# Patient Record
Sex: Male | Born: 1935 | Race: White | Hispanic: No | Marital: Married
Health system: Southern US, Community
[De-identification: ages and names within clinical notes are randomized; demographics above are authoritative.]

---

## 2005-05-23 ENCOUNTER — Emergency Department: Payer: Self-pay | Admitting: Emergency Medicine

## 2006-01-21 ENCOUNTER — Emergency Department: Payer: Self-pay | Admitting: Emergency Medicine

## 2006-01-21 ENCOUNTER — Other Ambulatory Visit: Payer: Self-pay

## 2006-12-22 IMAGING — CR DG CHEST 2V
1 series · 2 of 2 positions shown · non-contrast
Comparison: none

REASON FOR EXAM: Hypertension, chest pain
COMMENTS:

[Series 3435: postero_anterior · 0.11mm/px · 2 of 2 slices shown]
[im 1/2]
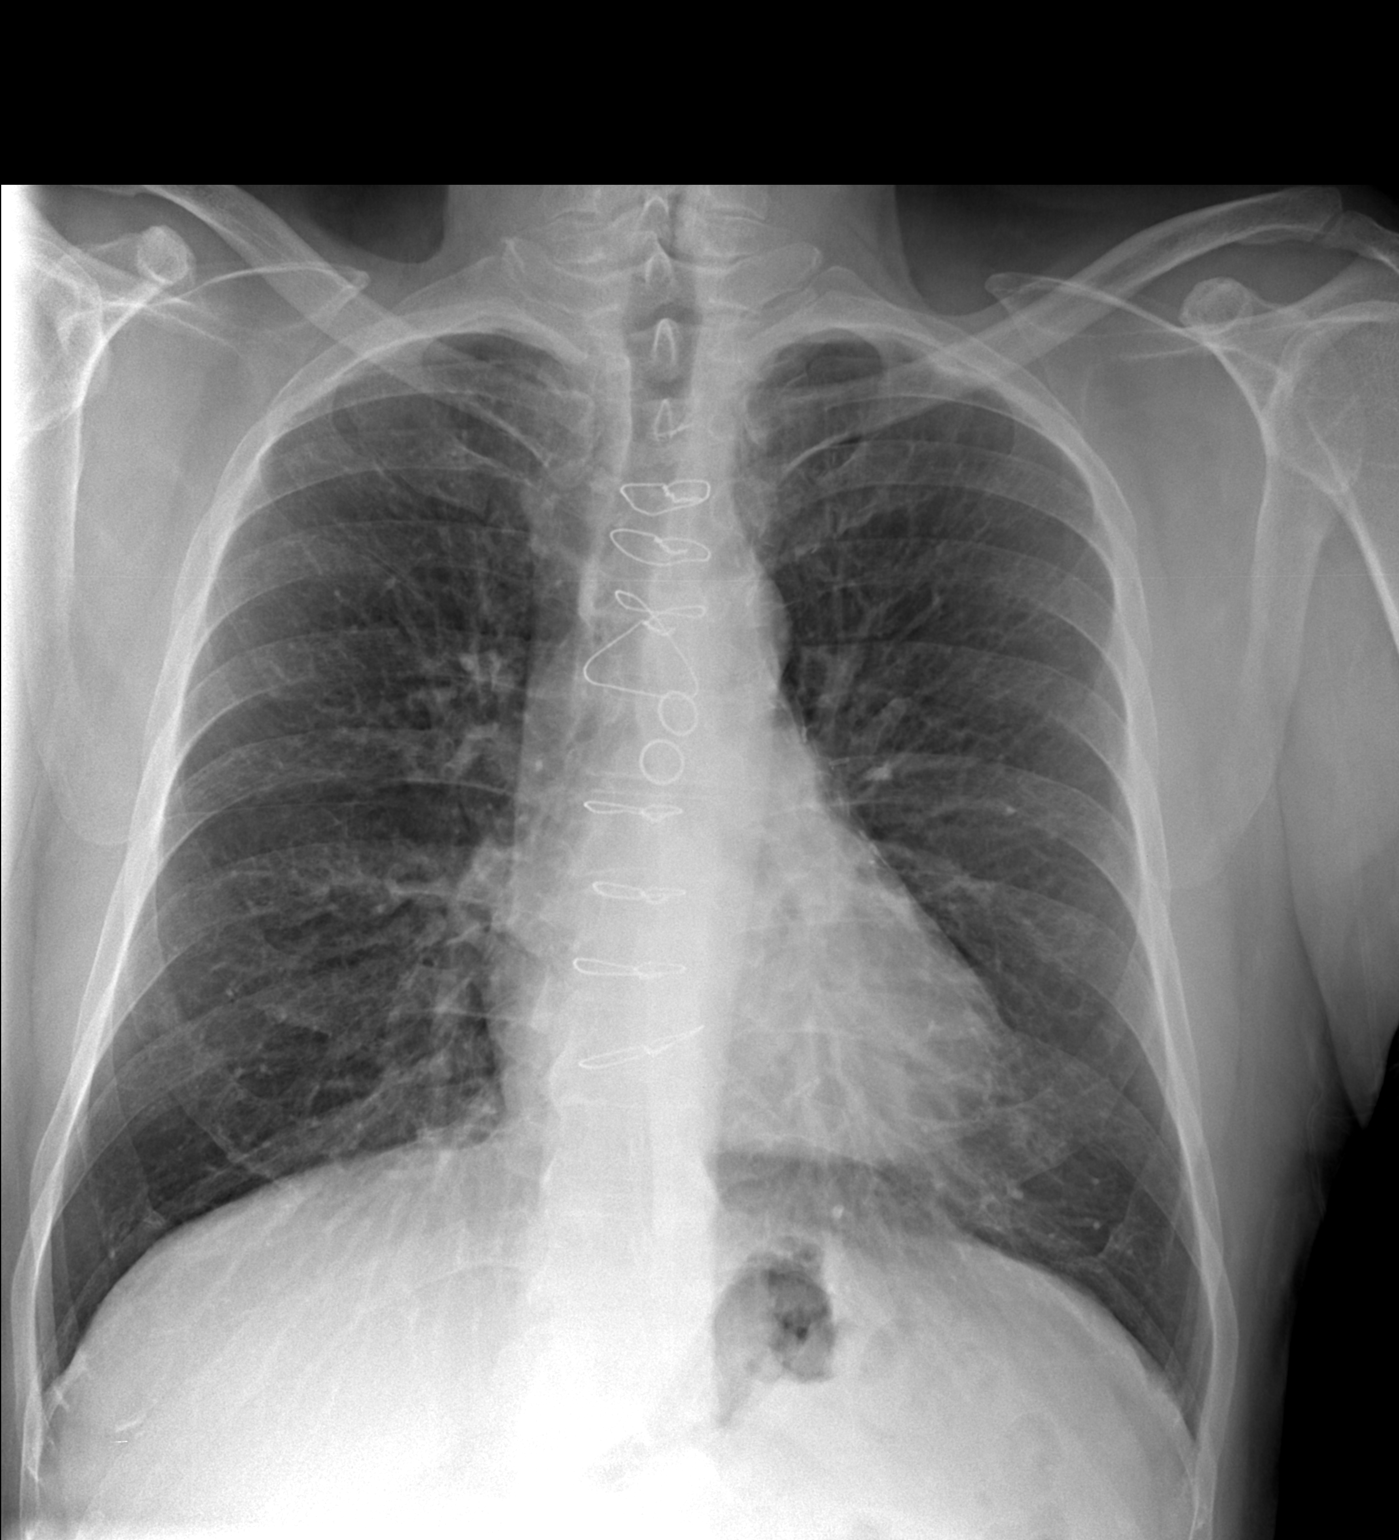
[im 2/2]
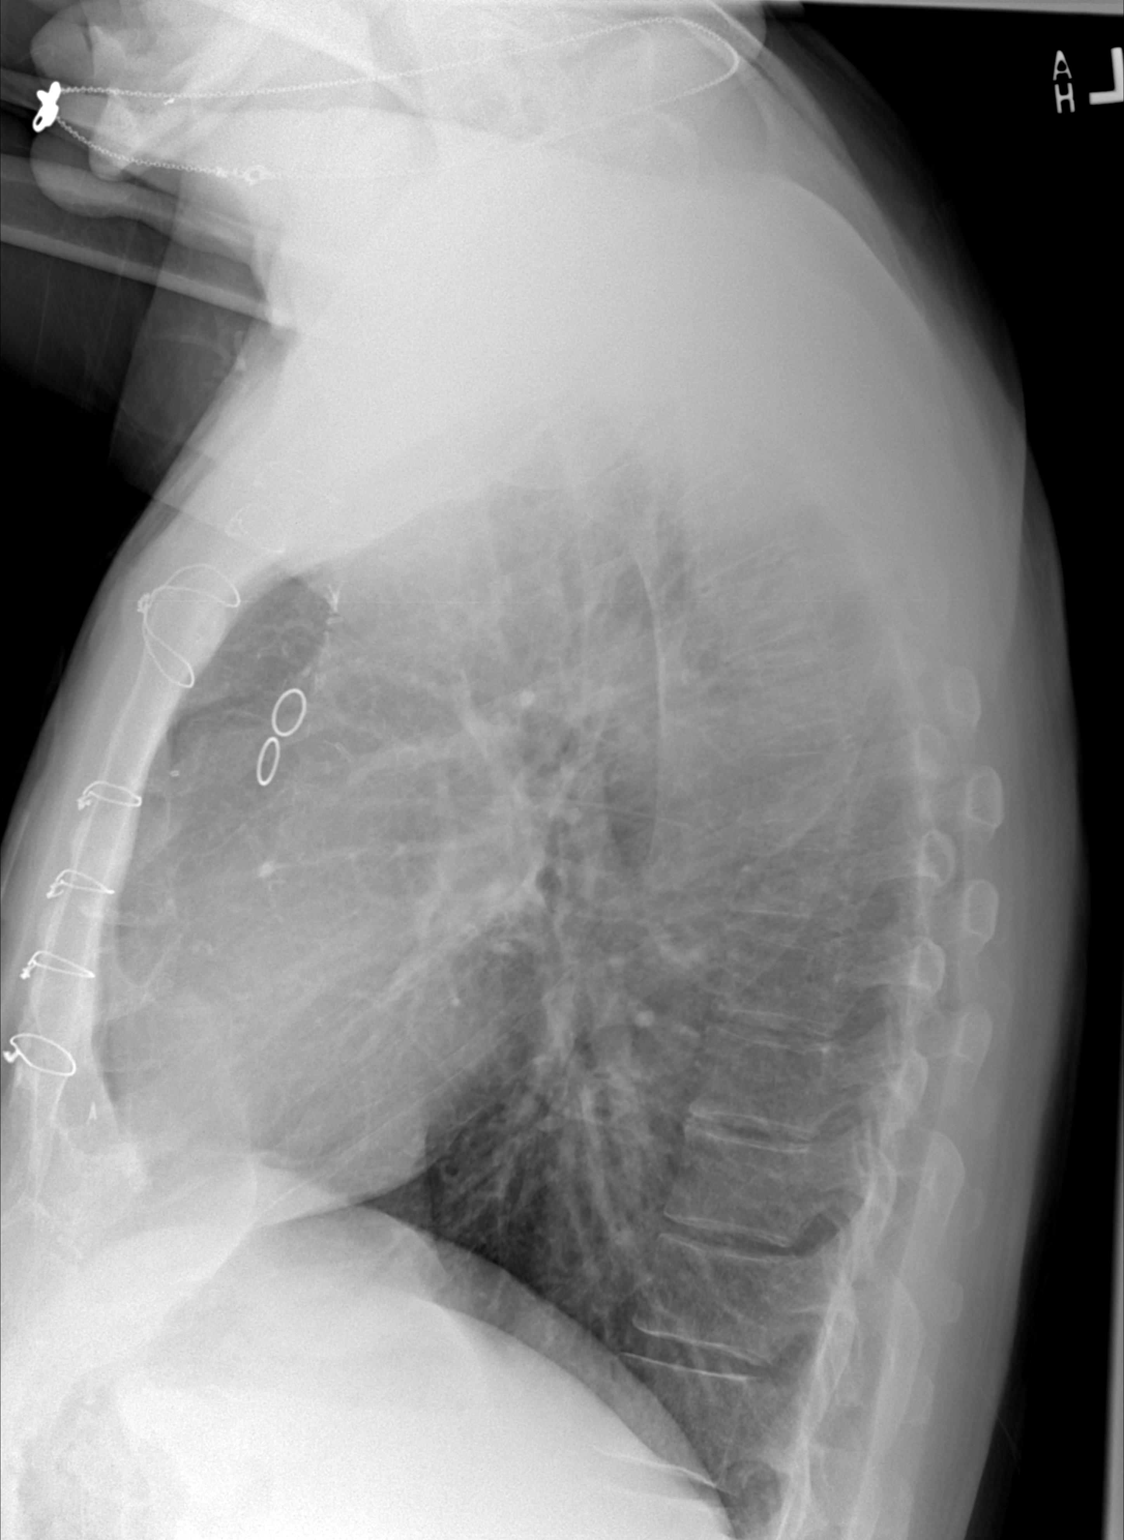

[2 of 2 positions shown; findings below may reference images not displayed]

PROCEDURE:     DXR - DXR CHEST PA (OR AP) AND LATERAL  - January 21, 2006 [DATE]

RESULT:     PA and lateral views reveal the cardiomediastinal structures to
be within normal limits. The lung fields are clear. The vascularity is
within normal limits. No effusions or pneumothoraces are seen. The patient
is status post CABG.
IMPRESSION: The lung fields are clear.

## 2014-01-04 ENCOUNTER — Inpatient Hospital Stay: Payer: Self-pay | Admitting: Internal Medicine

## 2014-01-04 LAB — CBC
HCT: 48.4 % (ref 40.0–52.0)
HGB: 16.1 g/dL (ref 13.0–18.0)
MCH: 30.7 pg (ref 26.0–34.0)
MCHC: 33.2 g/dL (ref 32.0–36.0)
MCV: 92 fL (ref 80–100)
PLATELETS: 191 10*3/uL (ref 150–440)
RBC: 5.25 10*6/uL (ref 4.40–5.90)
RDW: 14.3 % (ref 11.5–14.5)
WBC: 11.4 10*3/uL — ABNORMAL HIGH (ref 3.8–10.6)

## 2014-01-04 LAB — COMPREHENSIVE METABOLIC PANEL
ALK PHOS: 91 U/L
Albumin: 4.1 g/dL (ref 3.4–5.0)
Anion Gap: 7 (ref 7–16)
BUN: 14 mg/dL (ref 7–18)
Bilirubin,Total: 0.6 mg/dL (ref 0.2–1.0)
CO2: 27 mmol/L (ref 21–32)
Calcium, Total: 8.7 mg/dL (ref 8.5–10.1)
Chloride: 106 mmol/L (ref 98–107)
Creatinine: 1.27 mg/dL (ref 0.60–1.30)
GFR CALC NON AF AMER: 55 — AB
GLUCOSE: 137 mg/dL — AB (ref 65–99)
OSMOLALITY: 282 (ref 275–301)
Potassium: 3.6 mmol/L (ref 3.5–5.1)
SGOT(AST): 33 U/L (ref 15–37)
SGPT (ALT): 35 U/L (ref 12–78)
Sodium: 140 mmol/L (ref 136–145)
Total Protein: 8 g/dL (ref 6.4–8.2)

## 2014-01-04 LAB — APTT: Activated PTT: 29.1 secs (ref 23.6–35.9)

## 2014-01-04 LAB — TROPONIN I
Troponin-I: 0.08 ng/mL — ABNORMAL HIGH
Troponin-I: 0.64 ng/mL — ABNORMAL HIGH

## 2014-01-04 LAB — PROTIME-INR
INR: 0.9
Prothrombin Time: 12.3 secs (ref 11.5–14.7)

## 2014-01-04 LAB — CK-MB
CK-MB: 6.5 ng/mL — ABNORMAL HIGH (ref 0.5–3.6)
CK-MB: 128.1 ng/mL — ABNORMAL HIGH (ref 0.5–3.6)

## 2014-01-04 LAB — CK TOTAL AND CKMB (NOT AT ARMC)
CK, Total: 160 U/L (ref 35–232)
CK-MB: 2.6 ng/mL (ref 0.5–3.6)

## 2014-01-05 LAB — LIPID PANEL
Cholesterol: 190 mg/dL (ref 0–200)
HDL Cholesterol: 43 mg/dL (ref 40–60)
LDL CHOLESTEROL, CALC: 118 mg/dL — AB (ref 0–100)
Triglycerides: 145 mg/dL (ref 0–200)
VLDL Cholesterol, Calc: 29 mg/dL (ref 5–40)

## 2014-01-05 LAB — TSH: Thyroid Stimulating Horm: 1.47 u[IU]/mL

## 2014-01-05 LAB — APTT
ACTIVATED PTT: 59.7 s — AB (ref 23.6–35.9)
Activated PTT: 47 secs — ABNORMAL HIGH (ref 23.6–35.9)

## 2014-01-05 LAB — MAGNESIUM: Magnesium: 2 mg/dL

## 2014-01-05 LAB — HEMOGLOBIN A1C: HEMOGLOBIN A1C: 5.7 % (ref 4.2–6.3)

## 2014-01-06 LAB — BASIC METABOLIC PANEL
Anion Gap: 7 (ref 7–16)
BUN: 9 mg/dL (ref 7–18)
CHLORIDE: 105 mmol/L (ref 98–107)
Calcium, Total: 7.9 mg/dL — ABNORMAL LOW (ref 8.5–10.1)
Co2: 22 mmol/L (ref 21–32)
Creatinine: 0.94 mg/dL (ref 0.60–1.30)
Glucose: 111 mg/dL — ABNORMAL HIGH (ref 65–99)
Osmolality: 268 (ref 275–301)
Potassium: 4.7 mmol/L (ref 3.5–5.1)
SODIUM: 134 mmol/L — AB (ref 136–145)

## 2014-01-06 LAB — CK TOTAL AND CKMB (NOT AT ARMC)
CK, TOTAL: 322 U/L — AB (ref 35–232)
CK-MB: 15.1 ng/mL — ABNORMAL HIGH (ref 0.5–3.6)

## 2015-04-21 NOTE — H&P (Signed)
PATIENT NAME:  [name] Casey, [name] Casey MR#:  [id number] DATE OF BIRTH:  05-10-1935  DATE OF ADMISSION:  01/04/2014  PRIMARY CARE PHYSICIAN: None.   CHIEF COMPLAINT: Chest heaviness.   HISTORY OF PRESENT ILLNESS:  A 80 year old Caucasian male patient with history of CABG, hypertension, hyperlipidemia, not on any medications and medical care, presents to the Emergency Room complaining of acute onset of chest heaviness of 2 hours. The patient had similar pain yesterday at rest, which resolved. Today, this is in the midsternal area, heaviness with non-radiation. No aggravating or alleviating factors, other than improvement with nitro here in the Emergency Room. The patient's blood pressure is significantly elevated to 226/134, tachycardic. He complains of shortness of breath, but no wheezing or cough. No lower extremity swelling. No nausea or vomiting. Has diaphoresis. The patient had CABG 12 years back. Has not been on any medications or seen a doctor in 5 years. He continues to smoke 1/2 pack a day. He uses occasional alcohol.   PAST MEDICAL HISTORY:   1.  Coronary artery disease status post CABG 12 years back.  2.  Hypertension.  3.  Hyperlipidemia.  4.  Tobacco abuse.  5.  Past alcohol abuse.   FAMILY HISTORY: Coronary artery disease.   SOCIAL HISTORY: The patient smokes 1/2 pack a day. Occasional alcohol. Mentions he drinks about 3 to 4 drinks a week. No illicit drug use. Lives alone. Ambulates on his own.   CODE STATUS: Full code.   ALLERGIES: No known drug allergies.   HOME MEDICATIONS: None.   REVIEW OF SYSTEMS:  CONSTITUTIONAL: Complains of fatigue.  EYES: No blurred vision, pain or redness.  EARS, NOSE, THROAT: No tinnitus, ear pain or hearing loss.  RESPIRATORY: No cough, wheeze, hemoptysis or shortness of breath.  CARDIOVASCULAR: Has chest heaviness.  GASTROINTESTINAL: No nausea, vomiting or diarrhea.  GENITOURINARY: No dysuria, hematuria or frequency.  ENDOCRINE: No polyuria,  nocturia, thyroid problems.  HEMATOLOGIC AND LYMPHATIC: No anemia, easy bruising or bleeding.  INTEGUMENTARY: No acne, rash, lesions.  MUSCULOSKELETAL: No back pain or arthritis.  NEUROLOGIC: No focal numbness, weakness, seizures.  PSYCHIATRIC: No anxiety or depression.   PHYSICAL EXAMINATION: VITAL SIGNS: Shows temperature of 97.9, pulse of 101, blood pressure 226/134 and 172/103, saturating 97% on room air.  GENERAL: Elderly Caucasian male patient, sitting up in bed, anxious, in distress secondary to his chest pain.  PSYCHIATRIC: Alert, oriented x 3, anxious.  HEENT: Atraumatic, normocephalic. Oral mucosa moist and pink. External ears and nose normal. No pallor. No icterus.  NECK: Supple. No thyromegaly or palpable lymph nodes. Trachea midline. No carotid bruit or JVD.  CARDIOVASCULAR: S1, S2, tachycardic without any murmurs.  RESPIRATORY: Normal work of breathing. Clear to auscultation on both sides.  GASTROINTESTINAL: Soft abdomen, nontender. Bowel sounds present.  MUSCULOSKELETAL: No joint swelling, redness, effusion of the large joints. Normal muscle tone.  Motor strength 5/5 in upper and lower extremities.  LYMPHATIC: No cervical lymphadenopathy.   LABORATORY STUDIES: Show glucose 137, BUN 14, creatinine 1.27, sodium 140, potassium 3.6, chloride 106, GFR of 55. AST, ALT, alkaline phosphatase, bilirubin normal. Troponin 0.08. WBC 11.4, hemoglobin 16, platelets of 191 with INR of 0.9.   EKG shows normal sinus rhythm with sinus tachycardia, ST depression in lead II, III, aVF, which are worse compared to his prior EKG on record. He also has some ST depression in lateral leads. Poor R wave progression.   Chest x-ray shows no acute abnormalities, changes from prior CABG.   ASSESSMENT AND PLAN: 1.  Non-ST elevation myocardial infarction with EKG changes in a patient with no medical care, not on any medications. The patient will be started on aspirin and a beta blocker. We will give him a  stat dose of Lopressor 2.5 mg IV, along with nitro paste, morphine for his pain, oxygen. Dr. Juliann Casey of cardiology has been contacted, who will see the patient, and the patient will be going to the cath lab. We will admit the patient to stepdown on tele and keep him n.p.o. at this time. Depending on his ejection fraction, the patient can be started on ACE inhibitor in 48 hours after cath. No signs of congestive heart failure at this time.  2.  Accelerated hypertension. The patient will be started on a beta blocker.  3.  I counseled the patient to quit smoking. I have also stressed the importance of quitting smoking with his history of CABG and present MI. Counseling time spent was greater than 3 minutes.  4.  Hyperlipidemia. The patient will be started on a statin.  5.  Deep vein thrombosis prophylaxis. Presently, the patient is on heparin drip and will be started on Lovenox after this.   CODE STATUS: Full code.   Time spent today on this critically ill patient with NSTEMI is 45 minutes.  ____________________________ Molinda Bailiff Gabryelle Whitmoyer, MD srs:dmm D: 01/04/2014 10:35:48 ET T: 01/04/2014 11:11:16 ET JOB#: [id number]  cc: [name] Heath R. Jullian Clayson, MD, <Dictator> [name] D. Juliann Pares, MD Orie Fisherman MD ELECTRONICALLY SIGNED 01/17/2014 18:08

## 2015-04-21 NOTE — Consult Note (Signed)
PATIENT NAME:  [name] Casey, [name] Casey MR#:  [id number] DATE OF BIRTH:  [birth year]  DATE OF CONSULTATION:  01/04/2014  REFERRING PHYSICIAN:  Dr. Elpidio AnisSudini CONSULTING PHYSICIAN:  Dwayne D. Callwood, MD  INDICATION: Unstable angina.   HISTORY OF PRESENT ILLNESS: Mr. [name] Casey is a 80 year old white male with history of , hypertension, and hyperlipidemia. He is not on any significant medications. Quit taking his medicines about five years ago and had bypass about 12 years ago. Still smokes, used to drink, but quit. Blood pressures are significantly elevated above 200.  He presented with shortness of breath, chest tightness, congestion, pain on and off and at rest, worse today,  worse than he has ever had it even before his bypass. He presented for further evaluation.   PAST MEDICAL HISTORY: Coronary artery disease, hypertension, hyperlipidemia, tobacco abuse, past history of alcohol abuse and noncompliance.   FAMILY HISTORY: Coronary artery disease.   SOCIAL HISTORY: Smokes, occasional alcohol consumption. No drug use. Lives alone   ALLERGIES: None.   HOME MEDICATIONS:  None.   REVIEW OF SYSTEMS: No blackout spells or syncope. No nausea or vomiting. No fever, no chills, no sweats. No weight loss. No weight gain, hemoptysis, hematemesis. No bright red blood per rectum. Denies any urinary changes. No sputum production or cough.   PHYSICAL EXAMINATION:   VITAL SIGNS:  Blood pressure initially 200/130 improved to 170/100, respiratory rate 16, afebrile.  HEENT: Normocephalic, atraumatic. Pupils equal and reactive to light.  NECK: Supple. No significant JVD, bruits or adenopathy.  LUNGS: Clear to auscultation and percussion. No wheezing, rhonchi, or rale.   HEART: Regular rate and rhythm. Positive S4. Systolic ejection murmur at the apex.  ABDOMEN: Benign.  EXTREMITY: Within normal limits.  NEUROLOGIC: Intact.  SKIN: Normal.   LABORATORY, DIAGNOSTIC AND RADIOLOGIC DATA: Glucose of 137, BUN 14, creatinine  1.27, sodium 140, potassium 3.6, chloride 106, AST and ALT negative. Troponin 0.08. White count 114, hemoglobin 16, platelet count of 191. INR 0.9.   EKG: Normal sinus rhythm, nonspecific ST changes, diffuse ST segment depression.    Chest x-ray:  Unremarkable.   IMPRESSION: 1.  Possible non-Q-wave myocardial infarction.  2.  Unstable angina.  3.  Malignant hypertension.  4.  Hyperlipidemia.  5.  Elevated troponin.  6.  Coronary artery disease.  7.  History of smoking.   PLAN:  1.  Recommend therapy. 2.  Rule out for myocardial infarction.  3.  Follow up cardiac enzymes.  4.  Follow up troponins.  5.  Short-term anticoagulation.  6.  Aspirin therapy.  7.  Place on telemetry.  8.  Recommend blood pressure therapy for hypertension.  9.  Blood pressure control aggressively, possibly beta blocker and/or ACE inhibitors.  10.  Recommend therapy for coronary artery disease with aspirin, possibly add Plavix,  11.  Hypercholesterolemia. We will probably add statin therapy because of his abnormal EKG and recurrent symptoms, would recommend possibly therapy with heparin. Consider adding  Aggrastat.  12.  Would consider cardiac catheterization for further evaluation and monitoring.  13.  We will base further evaluation on the results of cardiac catheterization.    ____________________________ Bobbie Stackwayne D. Juliann Paresallwood, MD ddc:cc D: 01/04/2014 17:33:38 ET T: 01/04/2014 19:35:38 ET JOB#: [id number]  cc: Dwayne D. Juliann Paresallwood, MD, <Dictator> Alwyn PeaWAYNE D CALLWOOD MD ELECTRONICALLY SIGNED 02/07/2014 10:12

## 2015-04-21 NOTE — Discharge Summary (Signed)
PATIENT NAME:  [name] Casey Casey, [name] Casey MR#:  [id number] DATE OF BIRTH:  16-Mar-1935  DATE OF ADMISSION:  01/04/2014 DATE OF DISCHARGE:  01/06/2014  DISCHARGE DIAGNOSES:  1.  Non-ST-elevation myocardial infarction with percutaneous coronary intervention  with proximal saphenous venous graft to obtuse marginal artery.  2.  Hypertension.  3.  Tobacco abuse.  4.  Hyperlipidemia.  5.  Noncompliance.   IMAGING STUDIES DONE: Include a chest x-ray, which showed no acute abnormalities.   CONSULTS: Dr. Juliann Paresallwood.   PROCEDURES: Diagnostic cardiac cath done on 01/04/2014 showed ejection fraction of 55% with multivessel CAD with SVG to OM 95% proximal culprit vessel. Cardiac catheterization for PCI on 01/05/2014 had a drug-eluting stent placed.   ADMITTING HISTORY AND PHYSICAL AND HOSPITAL COURSE: Please see detailed H and P dictated previously. In brief, a 80 year old male patient presented to the hospital complaining of chest pain. He had a history of CABG not any medication for 5 years. He was noted to have accelerated hypertension. He was taken to the cath lab where he was found to have 95% proximal lesion in the SVG to OM. Cardiac cath was repeated on day 2 and had a drug-eluting stent placed. By the day of discharge, the patient does not have any chest pain. He has ambulated well. No complications and is being discharged home on aspirin, Plavix, statin and a beta blocker to follow up with Dr. Juliann Paresallwood in 2 weeks.   Prior to discharge, the patient had cardiac examination with S1, S2, without any murmurs. Lungs sound clear.   DISCHARGE MEDICATIONS: Include:  1.  Aspirin 81 mg daily.  2.  Plavix 75 mg daily.  3.  Metoprolol 25 mg b.i.d.  4.  Atorvastatin 40 mg oral once a day.   DISCHARGE INSTRUCTIONS: Low-sodium, low-fat diet. Activity as tolerated. Follow up with Dr. Juliann Paresallwood in 2 weeks. The patient has been advised to quit smoking and be compliant with his medications.    TIME SPENT TODAY ON THIS DISCHARGE  DICTATION: 40 minutes.  ____________________________ Molinda BailiffSrikar R. Fidelis Loth, MD srs:aw D: 01/06/2014 12:39:37 ET T: 01/06/2014 12:48:50 ET JOB#: [id number]  cc: Wardell HeathSrikar R. Lanaiya Lantry, MD, <Dictator> Dwayne D. Juliann Paresallwood, MD Orie FishermanSRIKAR R Tanelle Lanzo MD ELECTRONICALLY SIGNED 01/17/2014 18:09

## 2015-04-21 NOTE — Consult Note (Signed)
Chief Complaint:  Subjective/Chief Complaint Pt doing well today no groin issues. Denies cp no sob   VITAL SIGNS/ANCILLARY NOTES: **Vital Signs.:   09-Jan-15 07:28  Vital Signs Type Routine  Temperature Temperature (F) 98.1  Celsius 36.7  Pulse Pulse 90  Respirations Respirations 29  Systolic BP Systolic BP 136  Diastolic BP (mmHg) Diastolic BP (mmHg) 68  Mean BP 90  Pulse Ox % Pulse Ox % 95  Pulse Ox Activity Level  At rest  Oxygen Delivery Room Air/ 21 %  Pulse Ox Heart Rate 86  *Intake and Output.:   Daily 09-Jan-15 07:00  Grand Totals Intake:  2235 Output:  2850    Net:  -615 24 Hr.:  -615  Oral Intake      In:  480  Heparin      In:  66  IV (Primary)      In:  189  IV (Primary)      In:  1500  Urine ml     Out:  2850  Length of Stay Totals Intake:  4797.5 Output:  4775    Net:  22.5   Brief Assessment:  GEN well developed, well nourished, no acute distress   Cardiac Regular   Respiratory normal resp effort   Gastrointestinal Normal   Gastrointestinal details normal Soft  Nontender   EXTR negative cyanosis/clubbing, negative edema   Additional Physical Exam Right groin ok   Lab Results: Hepatic:  07-Jan-15 09:28   Bilirubin, Total 0.6  Alkaline Phosphatase 91 (45-117 NOTE: New Reference Range 11/18/13)  SGPT (ALT) 35  SGOT (AST) 33  Total Protein, Serum 8.0  Albumin, Serum 4.1  Routine Chem:  07-Jan-15 09:28   Glucose, Serum  137  BUN 14  Creatinine (comp) 1.27  Sodium, Serum 140  Potassium, Serum 3.6  Chloride, Serum 106  CO2, Serum 27  Calcium (Total), Serum 8.7  Anion Gap 7  Osmolality (calc) 282  eGFR (African American) >60  eGFR (Non-African American)  55 (eGFR values <60mL/min/1.73 m2 may be an indication of chronic kidney disease (CKD). Calculated eGFR is useful in patients with stable renal function. The eGFR calculation will not be reliable in acutely ill patients when serum creatinine is changing rapidly. It is not useful in   patients on dialysis. The eGFR calculation may not be applicable to patients at the low and high extremes of body sizes, pregnant women, and vegetarians.)  Result Comment TROPONIN - RESULTS VERIFIED BY REPEAT TESTING.  - [email or web address]  - READ-BACK PROCESS PERFORMED.  Result(s) reported on 04 Jan 2014 at 10:11AM.    10:29   Result Comment TROPONIN - RESULTS VERIFIED BY REPEAT TESTING.  - C/KATIE CLAYTON AT 1113 01/04/14-DAS  - READ-BACK PROCESS PERFORMED.  Result(s) reported on 04 Jan 2014 at 11:15AM.  Cardiac:  07-Jan-15 09:28   CK, Total 160  CPK-MB, Serum 2.6 (Result(s) reported on 04 Jan 2014 at 10:03AM.)  Troponin I  0.08 (0.00-0.05 0.05 ng/mL or less: NEGATIVE  Repeat testing in 3-6 hrs  if clinically indicated. >0.05 ng/mL: POTENTIAL  MYOCARDIAL INJURY. Repeat  testing in 3-6 hrs if  clinically indicated. NOTE: An increase or decrease  of 30% or more on serial  testing suggests a  clinically important change)    10:29   CPK-MB, Serum  6.5 (Result(s) reported on 04 Jan 2014 at 12:04PM.)  Troponin I  0.64 (0.00-0.05 0.05 ng/mL or less: NEGATIVE  Repeat testing in 3-6 hrs  if clinically indicated. >0.05 ng/mL: POTENTIAL  MYOCARDIAL   INJURY. Repeat  testing in 3-6 hrs if  clinically indicated. NOTE: An increase or decrease  of 30% or more on serial  testing suggests a  clinically important change)    18:30   CPK-MB, Serum  128.1 (Result(s) reported on 04 Jan 2014 at 07:02PM.)  Routine Coag:  07-Jan-15 09:28   Activated PTT (APTT) 29.1 (A HCT value >55% may artifactually increase the APTT. In one study, the increase was an average of 19%. Reference: "Effect on Routine and Special Coagulation Testing Values of Citrate Anticoagulant Adjustment in Patients with High HCT Values." American Journal of Clinical Pathology 2006;126:400-405.)  Prothrombin 12.3  INR 0.9 (INR reference interval applies to patients on anticoagulant therapy. A single INR  therapeutic range for coumarins is not optimal for all indications; however, the suggested range for most indications is 2.0 - 3.0. Exceptions to the INR Reference Range may include: Prosthetic heart valves, acute myocardial infarction, prevention of myocardial infarction, and combinations of aspirin and anticoagulant. The need for a higher or lower target INR must be assessed individually. Reference: The Pharmacology and Management of the Vitamin K  antagonists: the seventh ACCP Conference on Antithrombotic and Thrombolytic Therapy. LPFXT.0240 Sept:126 (3suppl): [id number]. A HCT value >55% may artifactually increase the PT.  In one study,  the increase was an average of 25%. Reference:  "Effect on Routine and Special Coagulation Testing Values of Citrate Anticoagulant Adjustment in Patients with High HCT Values." American Journal of Clinical Pathology 2006;126:400-405.)  Routine Hem:  07-Jan-15 09:28   WBC (CBC)  11.4  RBC (CBC) 5.25  Hemoglobin (CBC) 16.1  Hematocrit (CBC) 48.4  Platelet Count (CBC) 191 (Result(s) reported on 04 Jan 2014 at 09:50AM.)  MCV 92  MCH 30.7  MCHC 33.2  RDW 14.3   Assessment/Plan:  Assessment/Plan:  Assessment IMP [place] CAD HTN Hyperlipidemia Smoking S/P PCI DES .   Plan PLAN ASA 81 mg daily Plavix 75 mg daily x 19yrB-blockers ACE low dose Nicoderm patch Statin therapy incresae activity F/U cardiology 1-2 weeks   Electronic Signatures: CLujean AmelD (MD)  (Signed 09-Jan-15 09:21)  Authored: Chief Complaint, VITAL SIGNS/ANCILLARY NOTES, Brief Assessment, Lab Results, Assessment/Plan   Last Updated: 09-Jan-15 09:21 by CLujean AmelD (MD)

## 2015-04-21 NOTE — Consult Note (Signed)
Brief Consult Note: Diagnosis: [place] CAD.   Patient was seen by consultant.   Consult note dictated.   Recommend further assessment or treatment.   Orders entered.   Discussed with Attending MD.   Comments: IMP [place] CAD [place] HTN Hyperlipidemia Elevated troponin Abn Ekg Smoking . PLAN ASA 325 daily Heparin IV Plavix load Bp control Statin for lipids B-blockers Add ACE NTG paste Advise to quit smoking Consider ECHO Cardiac cath today.  Electronic Signatures: Dorothyann Pengallwood, Dwayne D (MD)  (Signed 07-Jan-15 12:49)  Authored: Brief Consult Note   Last Updated: 07-Jan-15 12:49 by Dorothyann Pengallwood, Dwayne D (MD)
